# Patient Record
Sex: Female | Born: 1995 | Race: Black or African American | Hispanic: No | Marital: Single | Smoking: Never smoker
Health system: Southern US, Community
[De-identification: ages and names within clinical notes are randomized; demographics above are authoritative.]

---
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
## 2017-06-06 IMAGING — US US TRANSVAGINAL NON-OB
1 series · 14 of 25 positions shown · non-contrast
Comparison: None

CLINICAL DATA: Irregular menses.

EXAM:
TRANSABDOMINAL AND TRANSVAGINAL ULTRASOUND OF PELVIS
TECHNIQUE: Both transabdominal and transvaginal ultrasound examinations of the
pelvis were performed. Transabdominal technique was performed for
global imaging of the pelvis including uterus, ovaries, adnexal
regions, and pelvic cul-de-sac. It was necessary to proceed with
endovaginal exam following the transabdominal exam to visualize the
uterus, ovaries, and adnexa.

[Series 1: us transvaginal non-ob · 0.15mm/px · 14 of 57 slices shown]
[im 1/57]
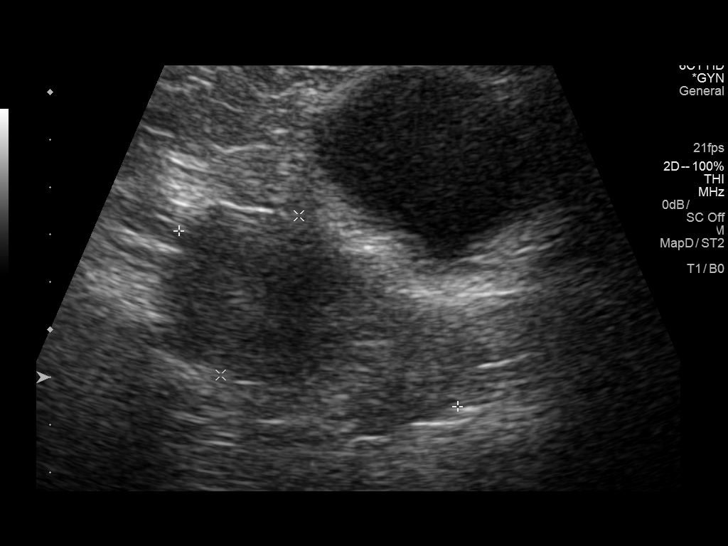
[im 5/57]
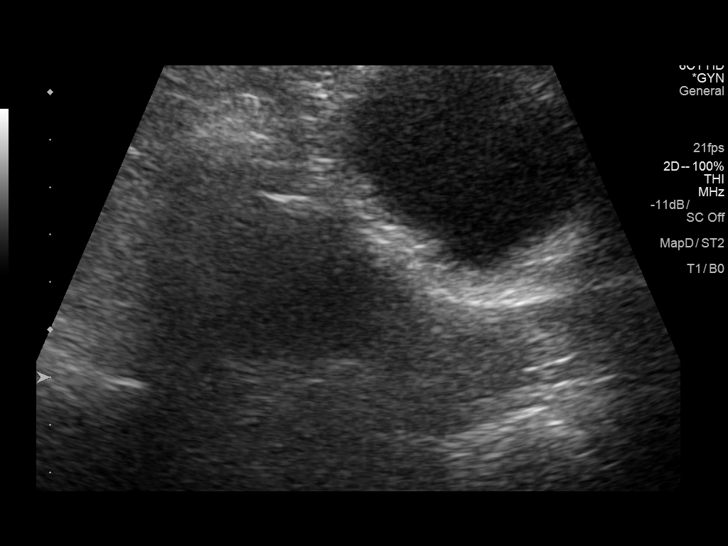
[im 10/57]
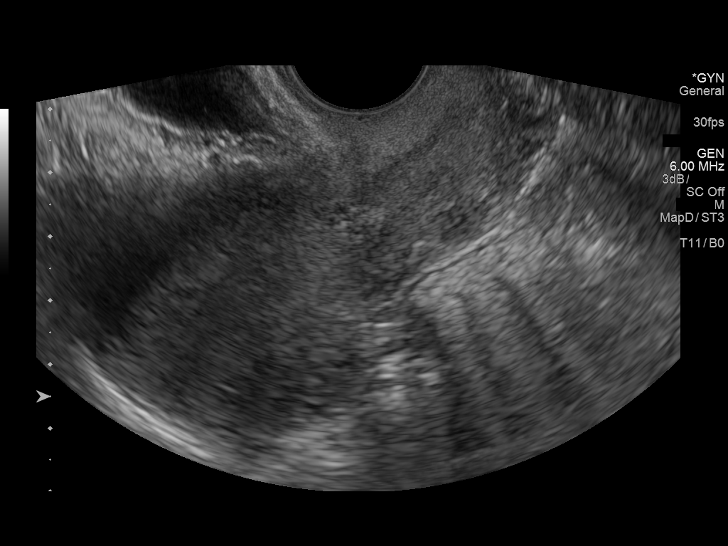
[im 15/57]
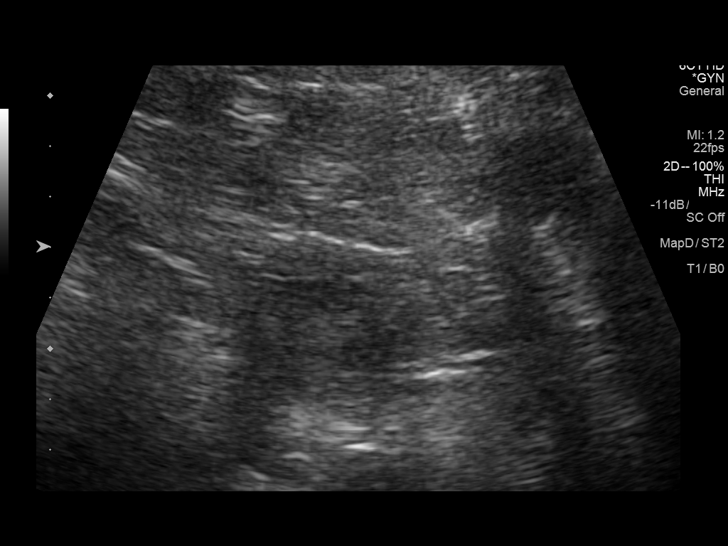
[im 19/57]
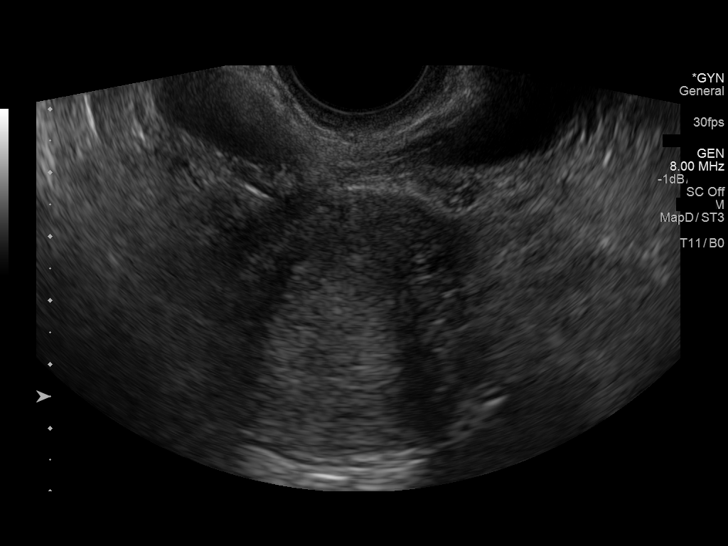
[im 22/57]
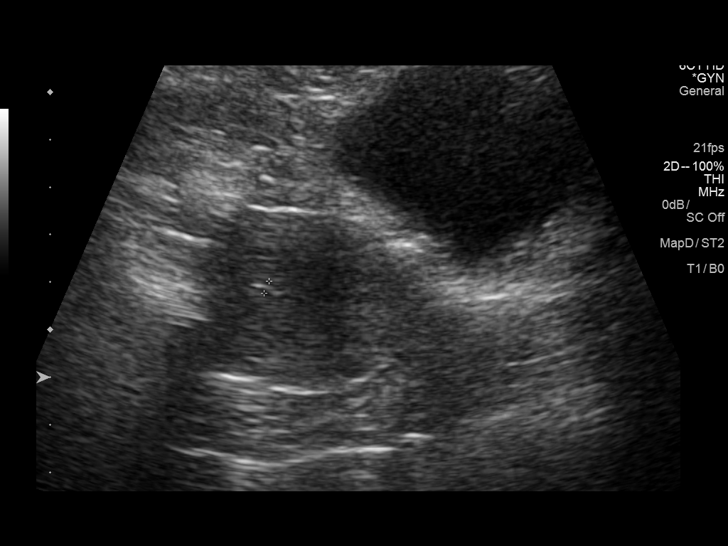
[im 26/57]
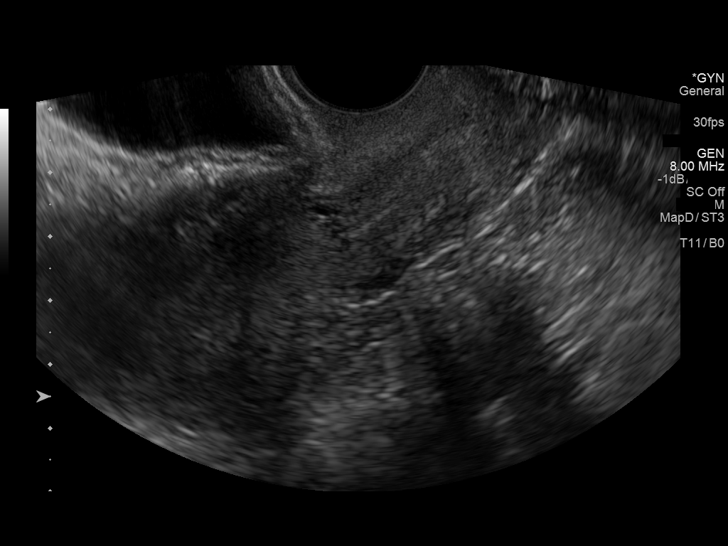
[im 31/57]
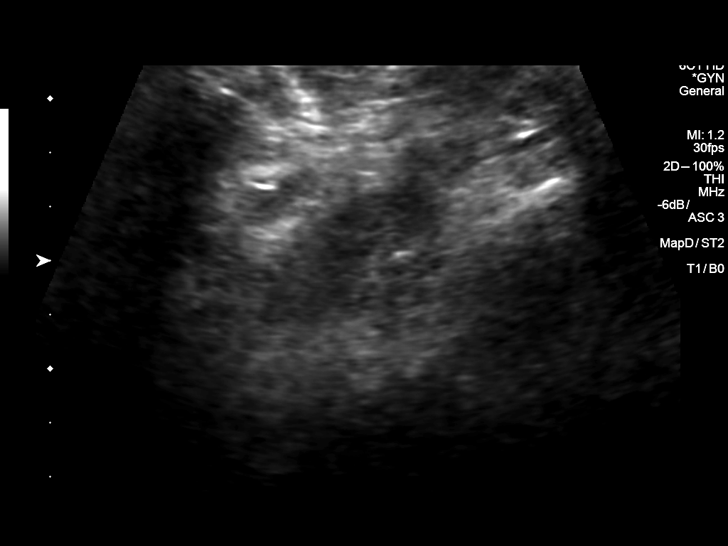
[im 36/57]
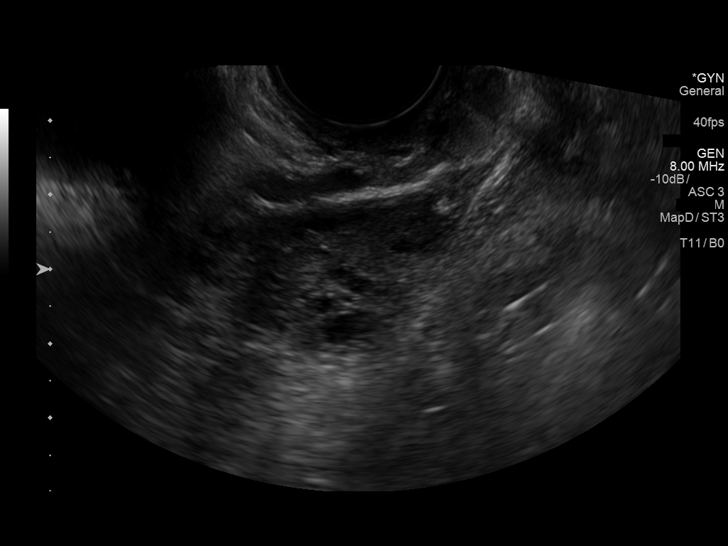
[im 38/57]
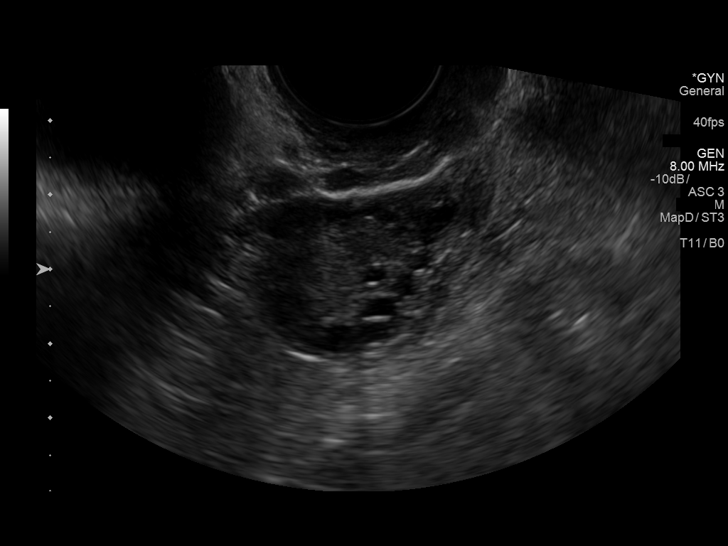
[im 43/57]
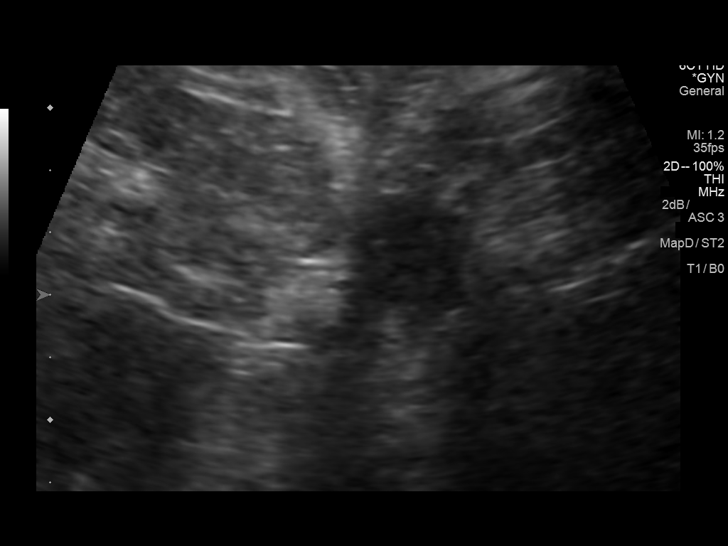
[im 47/57]
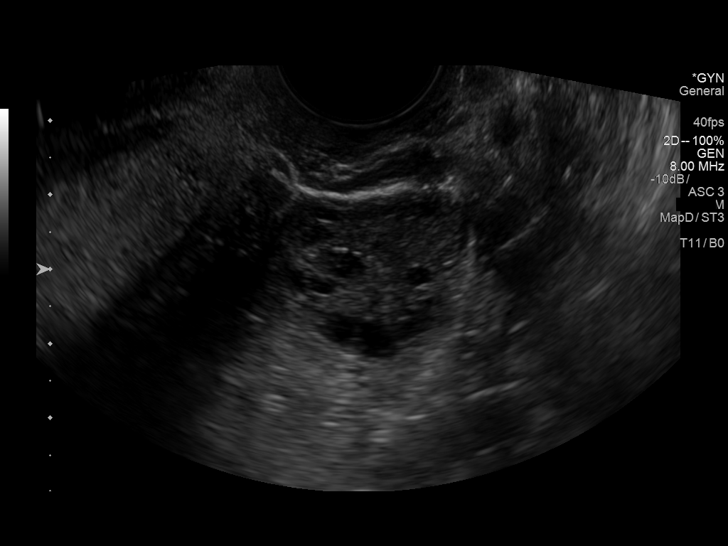
[im 52/57]
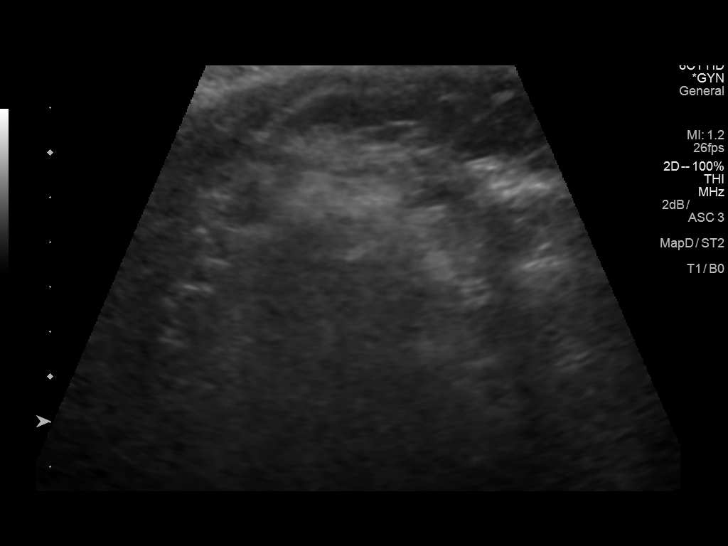
[im 57/57]
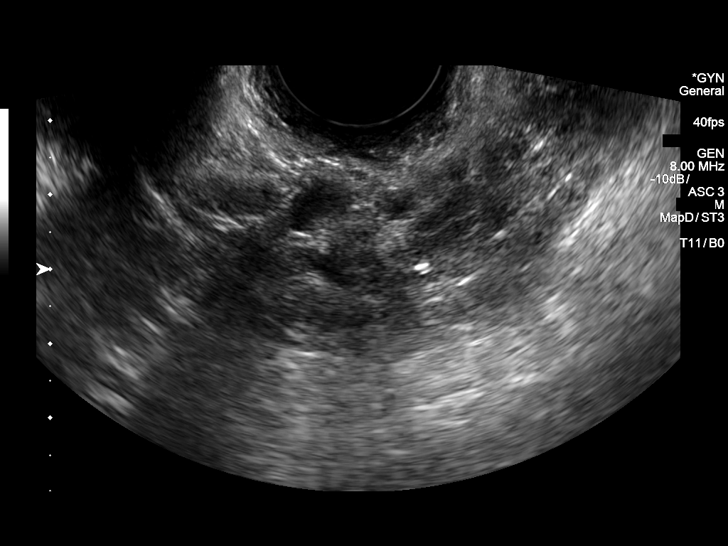

[14 of 25 positions shown; findings below may reference images not displayed]

FINDINGS: Uterus

Measurements: 6.9 x 3.7 x 4.1 cm. No fibroids or other mass
visualized.

Endometrium

Thickness: Normal, 3 mm..  No focal abnormality visualized.

Right ovary

Not visualized.  No adnexal mass.

Left ovary

Measurements: 3.7 x 2.1 x 2.5 cm.  Normal in morphology.

Other findings

No abnormal free fluid.
IMPRESSION: 1.  No acute process or explanation for irregular menses.
2. Lack of visualization of the right ovary.
